# Patient Record
Sex: Female | Born: 1985 | Hispanic: Yes | Marital: Single | Smoking: Never smoker
Health system: Southern US, Community
[De-identification: ages and names within clinical notes are randomized; demographics above are authoritative.]

## PROBLEM LIST (undated history)

## (undated) HISTORY — PX: CHOLECYSTECTOMY: SHX55

---

## 2008-03-09 ENCOUNTER — Emergency Department: Payer: Self-pay | Admitting: Emergency Medicine

## 2009-09-28 IMAGING — US US OB < 14 WEEKS
1 series · 17 of 28 positions shown · non-contrast
Comparison: none

REASON FOR EXAM: rm 9  abd /back pain
COMMENTS:   LMP: Mo Mahdi

[Series 1: us ob < 14 weeks · 17 of 32 slices shown]
[im 1/32]
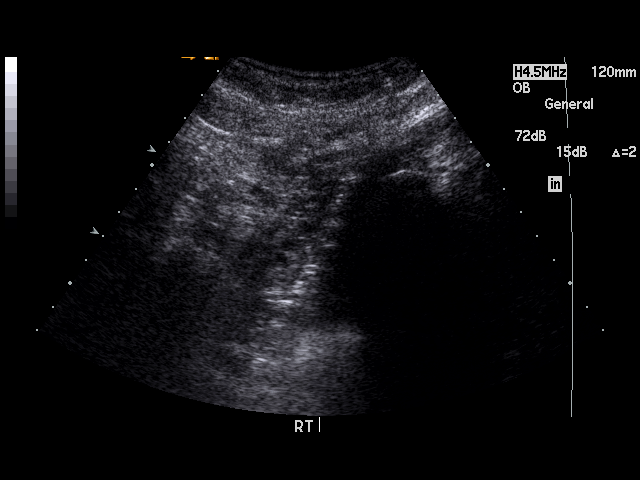
[im 3/32]
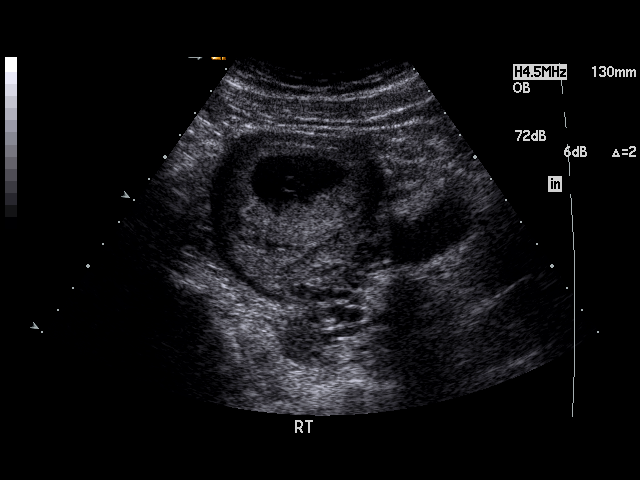
[im 5/32]
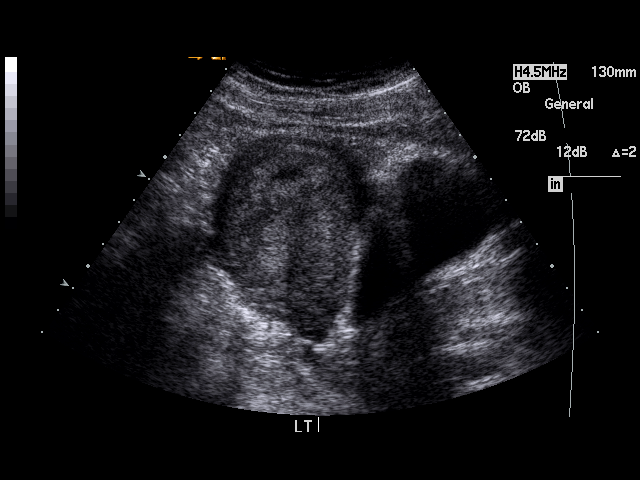
[im 6/32]
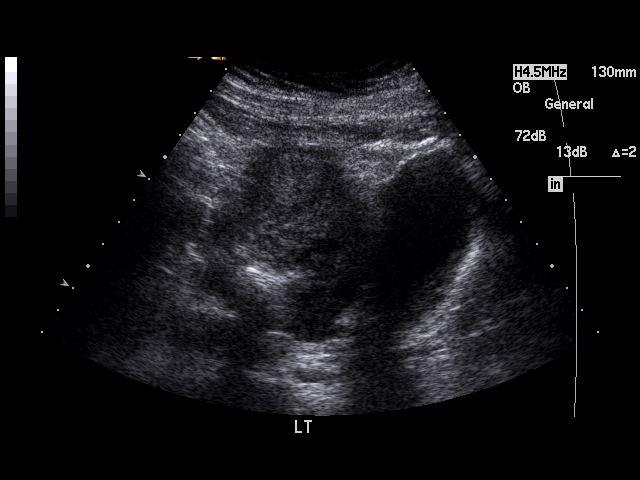
[im 9/32]
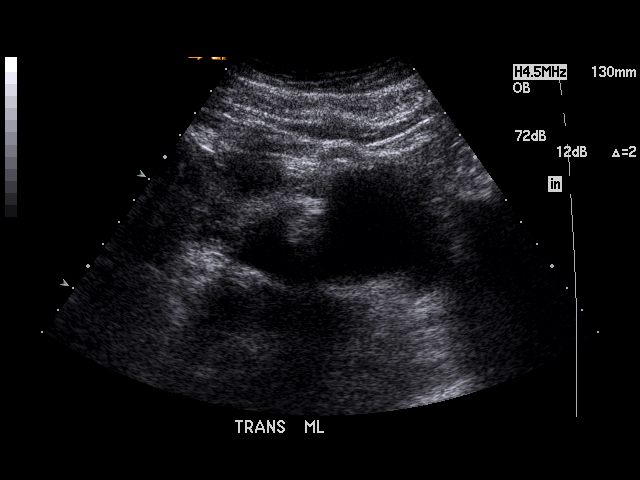
[im 11/32]
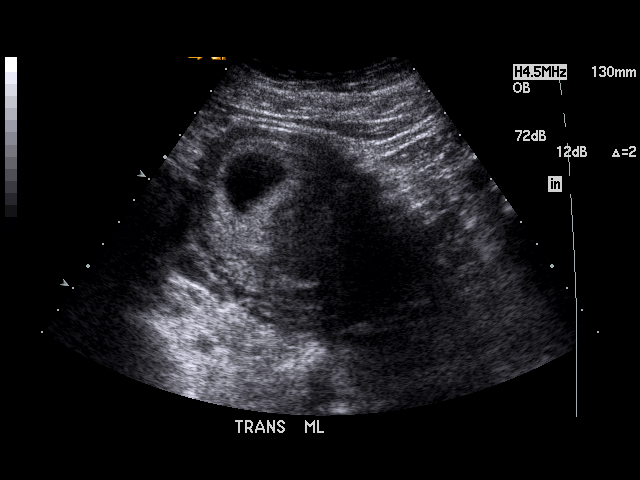
[im 12/32]
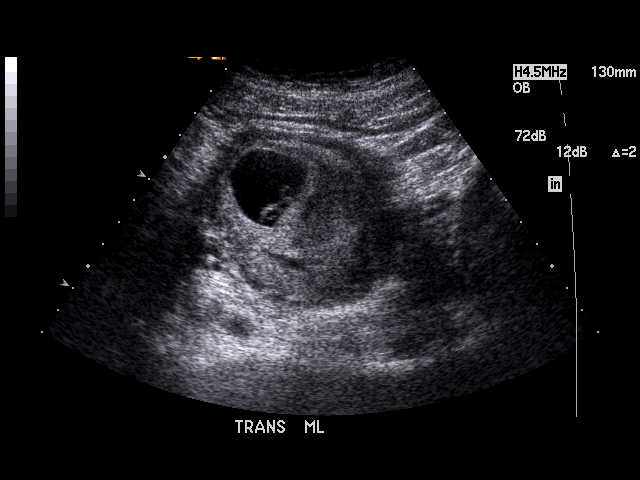
[im 14/32]
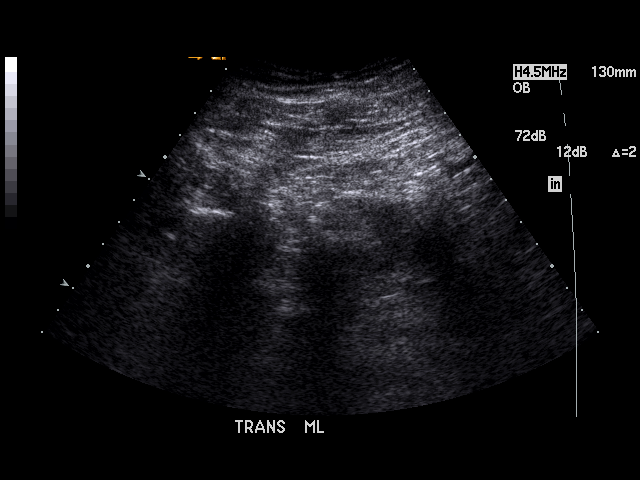
[im 17/32]
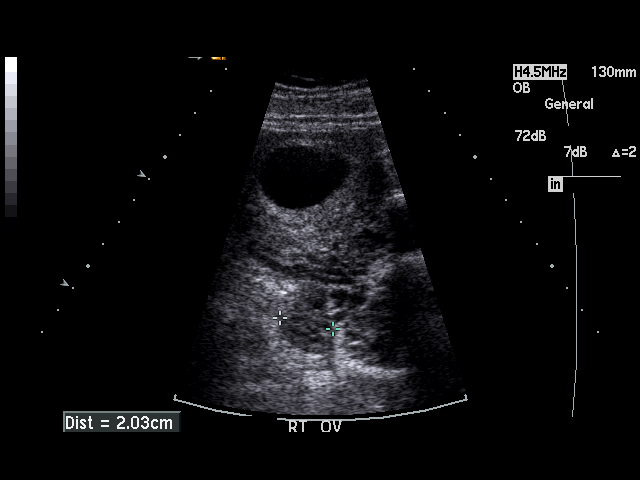
[im 18/32]
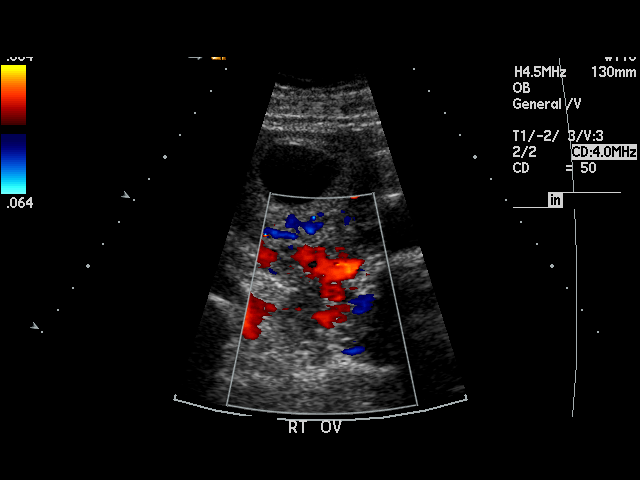
[im 20/32]
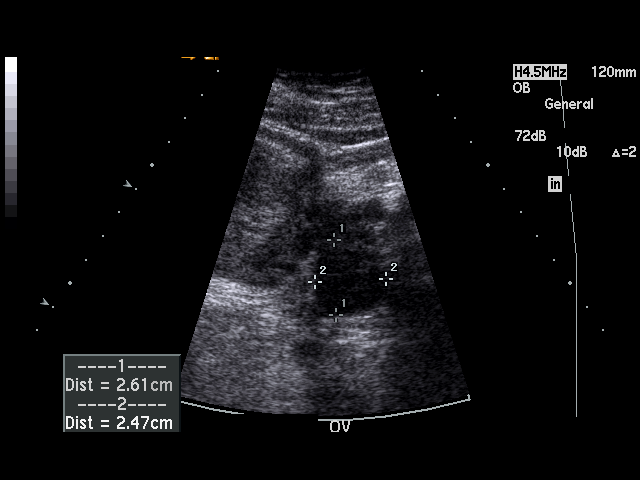
[im 21/32]
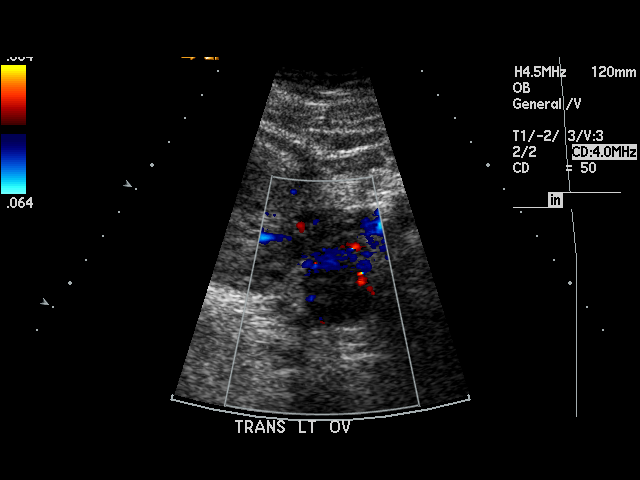
[im 23/32]
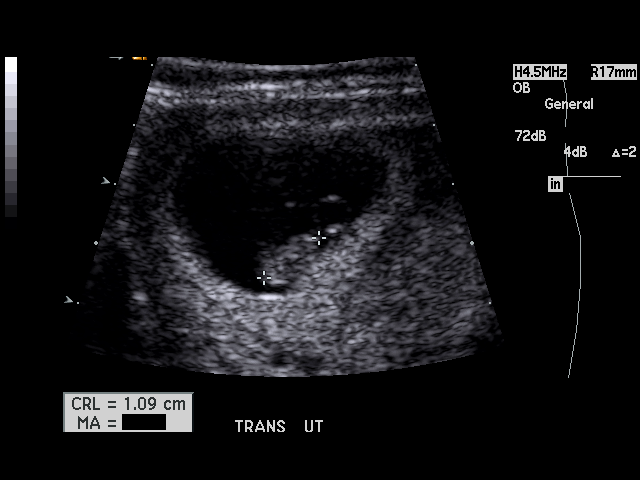
[im 26/32]
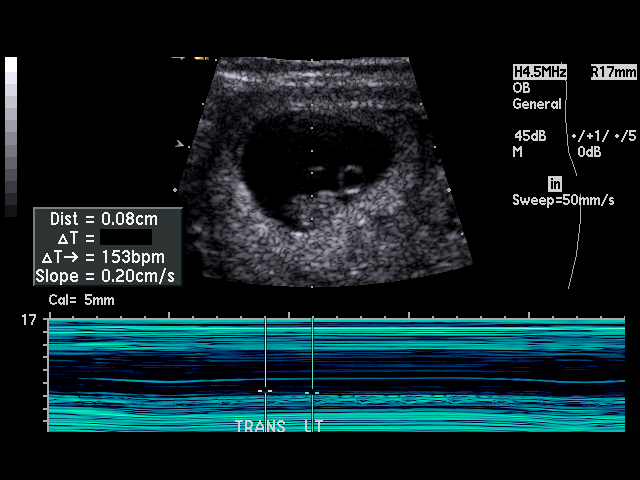
[im 27/32]
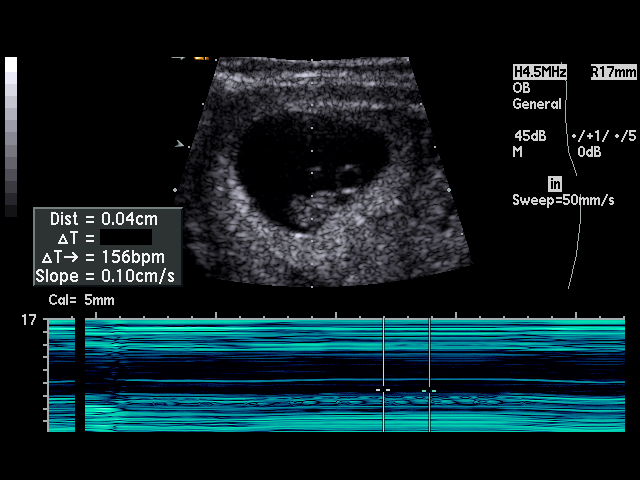
[im 29/32]
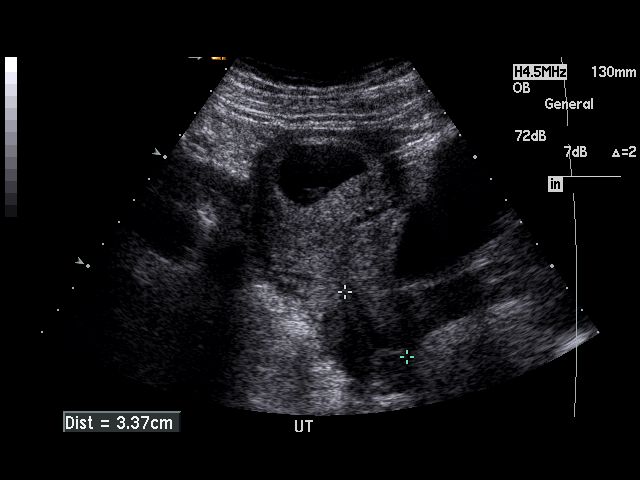
[im 32/32]
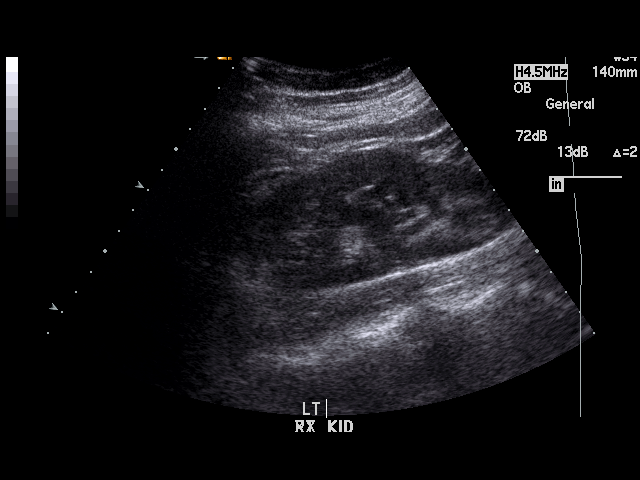

[17 of 28 positions shown; findings below may reference images not displayed]

PROCEDURE:     US  - US OB LESS THAN 14 WEEKS  - March 10, 2008  [DATE]

RESULT:     Emergent ultrasound of the pelvis demonstrates an intrauterine
gestation with a crown-rump length of 1.16 cm consistent with a 7 week 2 day
intrauterine gestation with an ultrasound estimated delivery date of
10/25/2008. There is a fetal heart rate of 153 to 156 beats per minute. There
is no free fluid. The ovaries appear to be unremarkable. Transabdominal
scanning only was performed. No definite uterine lesions are identified.
IMPRESSION: Early intrauterine gestation of 7 weeks 2 days with an estimated delivery
date of 10/25/2008. Routine followup is recommended. Please note, there is no
evidence of maternal hydronephrosis.

## 2019-01-03 ENCOUNTER — Emergency Department: Payer: No Typology Code available for payment source

## 2019-01-03 ENCOUNTER — Emergency Department
Admission: EM | Admit: 2019-01-03 | Discharge: 2019-01-03 | Disposition: A | Discharge status: Home / Self Care | Payer: Self-pay | Attending: Emergency Medicine | Admitting: Emergency Medicine

## 2019-01-03 ENCOUNTER — Encounter: Payer: Self-pay | Admitting: Emergency Medicine

## 2019-01-03 ENCOUNTER — Other Ambulatory Visit: Payer: Self-pay

## 2019-01-03 DIAGNOSIS — S60221A Contusion of right hand, initial encounter: Secondary | ICD-10-CM | POA: Insufficient documentation

## 2019-01-03 DIAGNOSIS — Y9389 Activity, other specified: Secondary | ICD-10-CM | POA: Insufficient documentation

## 2019-01-03 DIAGNOSIS — Y990 Civilian activity done for income or pay: Secondary | ICD-10-CM | POA: Insufficient documentation

## 2019-01-03 DIAGNOSIS — W228XXA Striking against or struck by other objects, initial encounter: Secondary | ICD-10-CM | POA: Insufficient documentation

## 2019-01-03 DIAGNOSIS — Y9289 Other specified places as the place of occurrence of the external cause: Secondary | ICD-10-CM | POA: Insufficient documentation

## 2019-01-03 MED ORDER — KETOROLAC TROMETHAMINE 30 MG/ML IJ SOLN
30.0000 mg | Freq: Once | INTRAMUSCULAR | Status: AC
  Administered 2019-01-03: 30 mg via INTRAVENOUS

## 2019-01-03 NOTE — ED Provider Notes (Signed)
[facility] Emergency Department Provider Note  ____________________________________________   First MD Initiated Contact with Patient 01/03/19 [phone]     (approximate)  I have reviewed the triage vital signs and the nursing notes.   HISTORY  Chief Complaint Hand Pain   HPI Jackee Tobin ChadMiranda Estrada is a 33 y.o. female presents to the emergency department with reported right hand injury which occurred while at work.  Patient states a piece of the machine fell hitting her on her middle/index finger of the right hand.  Patient admits to 9 out of 10 pain worse with movement.       History reviewed. No pertinent past medical history.  There are no active problems to display for this patient.   Past Surgical History:  Procedure Laterality Date  . CHOLECYSTECTOMY      Prior to Admission medications   Not on File    Allergies Patient has no known allergies.  History reviewed. No pertinent family history.  Social History Social History   Tobacco Use  . Smoking status: Never Smoker  . Smokeless tobacco: Never Used  Substance Use Topics  . Alcohol use: Not Currently  . Drug use: Never    Review of Systems Constitutional: No fever/chills Eyes: No visual changes. ENT: No sore throat. Cardiovascular: Denies chest pain. Respiratory: Denies shortness of breath. Gastrointestinal: No abdominal pain.  No nausea, no vomiting.  No diarrhea.  No constipation. Genitourinary: Negative for dysuria. Musculoskeletal: Negative for neck pain.  Negative for back pain.  Positive for right hand injury Integumentary: Negative for rash. Neurological: Negative for headaches, focal weakness or numbness.   ____________________________________________   PHYSICAL EXAM:  VITAL SIGNS: ED Triage Vitals  Enc Vitals Group     BP 01/03/19 0425 124/69     Pulse Rate 01/03/19 0425 68     Resp 01/03/19 0425 18     Temp 01/03/19 0425 98.3 F (36.8 C)     Temp  Source 01/03/19 0425 Oral     SpO2 01/03/19 0425 98 %     Weight 01/03/19 0426 59.4 kg (131 lb)     Height --      Head Circumference --      Peak Flow --      Pain Score 01/03/19 0426 10     Pain Loc --      Pain Edu? --      Excl. in [place]? --     Constitutional: Alert and oriented.  Apparent discomfort Eyes: Conjunctivae are normal.  Mouth/Throat: Mucous membranes are moist. Neck: No stridor.  No meningeal signs.   Cardiovascular: Normal rate, regular rhythm. Good peripheral circulation. Grossly normal heart sounds. Respiratory: Normal respiratory effort.  No retractions. Musculoskeletal: Pain to palpation of the second and third right metacarpal to metacarpophalangeal joints Neurologic:  Normal speech and language. No gross focal neurologic deficits are appreciated.  Skin:  Skin is warm, dry and intact. Psychiatric: Mood and affect are normal. Speech and behavior are normal.   RADIOLOGY I, [place] N , personally viewed and evaluated these images (plain radiographs) as part of my medical decision making, as well as reviewing the written report by the radiologist.  ED MD interpretation:    Official radiology report(s): Dg Finger Middle Right  Result Date: 01/03/2019 CLINICAL DATA:  Posttraumatic middle finger pain. EXAM: RIGHT MIDDLE FINGER 2+V COMPARISON:  None. FINDINGS: There is no evidence of fracture or dislocation. There is no evidence of arthropathy or other focal bone abnormality. Soft tissues  are unremarkable. IMPRESSION: Negative. Electronically Signed   By: Monte Fantasia M.D.   On: 01/03/2019 05:36     Procedures   ____________________________________________   INITIAL IMPRESSION / MDM / ASSESSMENT AND PLAN / ED COURSE  As part of my medical decision making, I reviewed the following data within the electronic MEDICAL RECORD NUMBER   33 year old female presented with above-stated history and physical exam secondary right hand injury.  X-ray revealed no  evidence of fracture or dislocation.  Suspect hand contusion as etiology for the patient's pain ice pack applied patient given IM Toradol.  ____________________________________________  FINAL CLINICAL IMPRESSION(S) / ED DIAGNOSES  Final diagnoses:  Contusion of right hand, initial encounter       MEDICATIONS GIVEN DURING THIS VISIT:  Medications  ketorolac (TORADOL) 30 MG/ML injection 30 mg (30 mg Intravenous Given 01/03/19 0510)     ED Discharge Orders    None      *Please note:  Ranessa Dajai Wahlert was evaluated in Emergency Department on 01/03/2019 for the symptoms described in the history of present illness. She was evaluated in the context of the global COVID-19 pandemic, which necessitated consideration that the patient might be at risk for infection with the SARS-CoV-2 virus that causes COVID-19. Institutional protocols and algorithms that pertain to the evaluation of patients at risk for COVID-19 are in a state of rapid change based on information released by regulatory bodies including the CDC and federal and state organizations. These policies and algorithms were followed during the patient's care in the ED.  Some ED evaluations and interventions may be delayed as a result of limited staffing during the pandemic.*  Note:  This document was prepared using Dragon voice recognition software and may include unintentional dictation errors.   Gregor Hams, MD 01/03/19 [phone]

## 2019-01-03 NOTE — ED Notes (Signed)
Signature pad not working. Hard copy printed and signed by patient.  

## 2019-01-03 NOTE — ED Notes (Addendum)
Pt verbalized understanding of discharge instructions using ipad interpreter. NAD at this time. 

## 2019-01-03 NOTE — ED Notes (Signed)
Pt given a blanket and spoke with pt through interpreter that we would get an xray of her wrist. Pt reports her pain has decreased a little.

## 2019-01-03 NOTE — ED Notes (Signed)
Pt to the ER for injuries sustained while at work. Pt was placing a tube on a machine when her hand was hit by the machine. Pt has pain to the 2nd digit of the right hand. Pt also has pain in the wrist.

## 2019-01-03 NOTE — ED Notes (Signed)
UDS complete per worker's comp profile. Urine obtained and walked down to lab by this tech.

## 2019-01-03 NOTE — ED Triage Notes (Signed)
Pt presents to ED from work after a machine malfunctioned and hit her middle finger on her right hand. Possible deformity noted. Increase in pain with movement. Pt works at [employer].

## 2019-08-05 ENCOUNTER — Other Ambulatory Visit: Payer: Self-pay

## 2019-08-05 ENCOUNTER — Emergency Department
Admission: EM | Admit: 2019-08-05 | Discharge: 2019-08-05 | Disposition: A | Discharge status: Home / Self Care | Payer: Self-pay | Attending: Student | Admitting: Student

## 2019-08-05 ENCOUNTER — Encounter: Payer: Self-pay | Admitting: Emergency Medicine

## 2019-08-05 DIAGNOSIS — Y9289 Other specified places as the place of occurrence of the external cause: Secondary | ICD-10-CM | POA: Insufficient documentation

## 2019-08-05 DIAGNOSIS — W4904XA Ring or other jewelry causing external constriction, initial encounter: Secondary | ICD-10-CM | POA: Insufficient documentation

## 2019-08-05 DIAGNOSIS — Y999 Unspecified external cause status: Secondary | ICD-10-CM | POA: Insufficient documentation

## 2019-08-05 DIAGNOSIS — Y939 Activity, unspecified: Secondary | ICD-10-CM | POA: Insufficient documentation

## 2019-08-05 DIAGNOSIS — S00551A Superficial foreign body of lip, initial encounter: Secondary | ICD-10-CM | POA: Insufficient documentation

## 2019-08-05 MED ORDER — LIDOCAINE HCL (PF) 1 % IJ SOLN
5.0000 mL | Freq: Once | INTRAMUSCULAR | Status: AC
  Administered 2019-08-05: 5 mL via INTRADERMAL
  Filled 2019-08-05: qty 5

## 2019-08-05 MED ORDER — AMOXICILLIN 500 MG PO CAPS: 500.0000 mg | ORAL_CAPSULE | Freq: Three times a day (TID) | ORAL | 0 refills | Status: AC

## 2019-08-05 NOTE — ED Provider Notes (Signed)
[facility] Emergency Department Provider Note  ____________________________________________   First MD Initiated Contact with Patient 08/05/19 1559     (approximate)  I have reviewed the triage vital signs and the nursing notes.   HISTORY  Chief Complaint Foreign Body    HPI Molly Lawson is a 34 y.o. female presents emergency department complaining of a infected/lost piercing in her lower lip.  Had her lip pierced about 8 days ago.  She cannot find the back of the stud to take it out.  No fever or chills.    History reviewed. No pertinent past medical history.  There are no problems to display for this patient.   Past Surgical History:  Procedure Laterality Date  . CHOLECYSTECTOMY      Prior to Admission medications   Medication Sig Start Date End Date Taking? Authorizing Provider  amoxicillin (AMOXIL) 500 MG capsule Take 1 capsule (500 mg total) by mouth 3 (three) times daily. 08/05/19   Versie Starks, PA-C    Allergies Patient has no known allergies.  No family history on file.  Social History Social History   Tobacco Use  . Smoking status: Never Smoker  . Smokeless tobacco: Never Used  Substance Use Topics  . Alcohol use: Not Currently  . Drug use: Never    Review of Systems  Constitutional: No fever/chills Eyes: No visual changes. ENT: No sore throat. Respiratory: Denies cough Cardiovascular: Denies chest pain Gastrointestinal: Denies abdominal pain Genitourinary: Negative for dysuria. Musculoskeletal: Negative for back pain. Skin: Negative for rash.  Positive foreign body in the lower lip Psychiatric: no mood changes,     ____________________________________________   PHYSICAL EXAM:  VITAL SIGNS: ED Triage Vitals  Enc Vitals Group     BP 08/05/19 1555 123/78     Pulse Rate 08/05/19 1555 79     Resp 08/05/19 1555 16     Temp 08/05/19 1555 98.5 F (36.9 C)     Temp Source 08/05/19 1555 Oral   SpO2 08/05/19 1555 99 %     Weight 08/05/19 1556 130 lb 1.1 oz (59 kg)     Height --      Head Circumference --      Peak Flow --      Pain Score 08/05/19 1556 7     Pain Loc --      Pain Edu? --      Excl. in [place]? --     Constitutional: Alert and oriented. Well appearing and in no acute distress. Eyes: Conjunctivae are normal.  Head: Atraumatic. Nose: No congestion/rhinnorhea. Mouth/Throat: Mucous membranes are moist.  Positive foreign body in the lower lip Neck:  supple no lymphadenopathy noted Cardiovascular: Normal rate, regular rhythm. Respiratory: Normal respiratory effort.  No retractions, GU: deferred Musculoskeletal: FROM all extremities, warm and well perfused Neurologic:  Normal speech and language.  Skin:  Skin is warm, dry and intact. No rash noted.  No redness or swelling, positive foreign body Psychiatric: Mood and affect are normal. Speech and behavior are normal.  ____________________________________________   LABS (all labs ordered are listed, but only abnormal results are displayed)  Labs Reviewed - No data to display ____________________________________________   ____________________________________________  RADIOLOGY    ____________________________________________   PROCEDURES  Procedure(s) performed:   .Foreign Body Removal  Date/Time: 08/05/2019 5:07 PM Performed by: Versie Starks, PA-C Authorized by: Versie Starks, PA-C  Consent: Verbal consent obtained. Consent given by: patient Patient understanding: patient states understanding of the  procedure being performed Site marked: the operative site was marked Body area: skin General location: head/neck Location details: mouth Anesthesia: local infiltration  Anesthesia: Local Anesthetic: lidocaine 1% without epinephrine  Sedation: Patient sedated: no  Patient restrained: no Patient cooperative: yes Localization method: probed and visualized Removal mechanism: hemostat Dressing:  dressing applied Tendon involvement: none Depth: subcutaneous Complexity: simple 1 objects recovered. Patient tolerance: patient tolerated the procedure well with no immediate complications Comments: Lip ring was removed      ____________________________________________   INITIAL IMPRESSION / ASSESSMENT AND PLAN / ED COURSE  Pertinent labs & imaging results that were available during my care of the patient were reviewed by me and considered in my medical decision making (see chart for details).   Patient is 34 year old female presents emergency department due to a embedded lip ring.  See HPI  Physical exam shows the base of the stud to be embedded in the lip.  Explained the findings and procedure to the patient.  See procedure note for foreign body removal.  Patient tolerated procedure well.  She was given a prescription for amoxicillin 500 3 times daily for 10 days.  She is to follow-up with her regular doctor return emergency department if worsening.  She states she understands will comply.  She discharged stable condition.    Molly Lawson was evaluated in Emergency Department on 08/05/2019 for the symptoms described in the history of present illness. She was evaluated in the context of the global COVID-19 pandemic, which necessitated consideration that the patient might be at risk for infection with the SARS-CoV-2 virus that causes COVID-19. Institutional protocols and algorithms that pertain to the evaluation of patients at risk for COVID-19 are in a state of rapid change based on information released by regulatory bodies including the CDC and federal and state organizations. These policies and algorithms were followed during the patient's care in the ED.   As part of my medical decision making, I reviewed the following data within the electronic MEDICAL RECORD NUMBER Nursing notes reviewed and incorporated, Old chart reviewed, Notes from prior ED visits and [place] Controlled  Substance Database  ____________________________________________   FINAL CLINICAL IMPRESSION(S) / ED DIAGNOSES  Final diagnoses:  Foreign body in lip, initial encounter      NEW MEDICATIONS STARTED DURING THIS VISIT:  New Prescriptions   AMOXICILLIN (AMOXIL) 500 MG CAPSULE    Take 1 capsule (500 mg total) by mouth 3 (three) times daily.     Note:  This document was prepared using Dragon voice recognition software and may include unintentional dictation errors.    Faythe Ghee, PA-C 08/05/19 1711    Miguel Aschoff., MD 08/05/19 2214

## 2019-08-05 NOTE — ED Notes (Signed)
See triage note states she had a piercing to lower lip about 8 days ago  States she is having swelling also to lip  States she is not able to find the back of the stud

## 2019-08-05 NOTE — ED Triage Notes (Signed)
Patient states she had lip ring placed 8 days ago. Patient reports now having some swelling in bottom lip and cannot find end of stud. Patient would like to have it removed. Denies fever or drainage.

## 2019-11-18 ENCOUNTER — Other Ambulatory Visit: Payer: Self-pay

## 2019-11-18 ENCOUNTER — Emergency Department
Admission: EM | Admit: 2019-11-18 | Discharge: 2019-11-18 | Disposition: A | Discharge status: Home / Self Care | Payer: Self-pay | Attending: Emergency Medicine | Admitting: Emergency Medicine

## 2019-11-18 DIAGNOSIS — H5712 Ocular pain, left eye: Secondary | ICD-10-CM | POA: Insufficient documentation

## 2019-11-18 DIAGNOSIS — Z5321 Procedure and treatment not carried out due to patient leaving prior to being seen by health care provider: Secondary | ICD-10-CM | POA: Insufficient documentation

## 2019-11-18 NOTE — ED Notes (Signed)
Pt walked out of waiting room, visitor with pt repors they are leaving.

## 2019-11-18 NOTE — ED Triage Notes (Signed)
Pt states was at work at when she felt something fall into her left eye. Pt works at [employer] in [place]. Pt states was wearing safety glasses. Pt states eye is painful, has light sensitivity and itches.

## 2019-11-18 NOTE — ED Notes (Signed)
Triage assist with spanish interpreter with [place], damian [phone]

## 2019-11-18 NOTE — ED Notes (Signed)
Pt will need drug screen, beth ed tech notified.

## 2019-11-18 NOTE — ED Notes (Signed)
Urine specimen collected for [facility], released to lab
